# Patient Record
Sex: Female | Born: 1955 | Race: Black or African American | Hispanic: No | Marital: Married | Smoking: Never smoker
Health system: Southern US, Community
[De-identification: ages and names within clinical notes are randomized; demographics above are authoritative.]

## PROBLEM LIST (undated history)

## (undated) ENCOUNTER — Inpatient Hospital Stay: Admission: EM | Payer: Self-pay | Source: Home / Self Care

## (undated) DIAGNOSIS — I10 Essential (primary) hypertension: Secondary | ICD-10-CM

## (undated) DIAGNOSIS — M1990 Unspecified osteoarthritis, unspecified site: Secondary | ICD-10-CM

## (undated) DIAGNOSIS — K219 Gastro-esophageal reflux disease without esophagitis: Secondary | ICD-10-CM

## (undated) DIAGNOSIS — M5430 Sciatica, unspecified side: Secondary | ICD-10-CM

## (undated) HISTORY — DX: Unspecified osteoarthritis, unspecified site: M19.90

---

## 2004-04-11 ENCOUNTER — Ambulatory Visit: Payer: Self-pay | Admitting: Family Medicine

## 2004-06-23 ENCOUNTER — Ambulatory Visit: Payer: Self-pay | Admitting: Family Medicine

## 2006-05-07 ENCOUNTER — Ambulatory Visit: Payer: Self-pay | Admitting: Family Medicine

## 2011-06-23 DIAGNOSIS — R0602 Shortness of breath: Secondary | ICD-10-CM

## 2012-04-26 ENCOUNTER — Ambulatory Visit (HOSPITAL_COMMUNITY)
Admission: RE | Admit: 2012-04-26 | Discharge: 2012-04-26 | Disposition: A | Discharge status: Home / Self Care | Payer: BC Managed Care – PPO | Source: Ambulatory Visit | Attending: Adult Health Nurse Practitioner | Admitting: Adult Health Nurse Practitioner

## 2012-04-26 ENCOUNTER — Other Ambulatory Visit (HOSPITAL_COMMUNITY): Payer: Self-pay | Admitting: Adult Health Nurse Practitioner

## 2012-04-26 DIAGNOSIS — M25559 Pain in unspecified hip: Secondary | ICD-10-CM | POA: Insufficient documentation

## 2012-04-26 DIAGNOSIS — M545 Low back pain, unspecified: Secondary | ICD-10-CM | POA: Insufficient documentation

## 2012-04-26 DIAGNOSIS — M25569 Pain in unspecified knee: Secondary | ICD-10-CM | POA: Insufficient documentation

## 2012-04-26 DIAGNOSIS — M25551 Pain in right hip: Secondary | ICD-10-CM

## 2012-11-30 ENCOUNTER — Other Ambulatory Visit (HOSPITAL_COMMUNITY): Payer: Self-pay | Admitting: Adult Health Nurse Practitioner

## 2012-11-30 DIAGNOSIS — R109 Unspecified abdominal pain: Secondary | ICD-10-CM

## 2012-12-05 ENCOUNTER — Ambulatory Visit (HOSPITAL_COMMUNITY): Payer: BC Managed Care – PPO

## 2012-12-06 ENCOUNTER — Ambulatory Visit (HOSPITAL_COMMUNITY)
Admit: 2012-12-06 | Discharge: 2012-12-06 | Disposition: A | Discharge status: Home / Self Care | Payer: BC Managed Care – PPO | Source: Ambulatory Visit | Attending: Adult Health Nurse Practitioner | Admitting: Adult Health Nurse Practitioner

## 2012-12-06 DIAGNOSIS — R112 Nausea with vomiting, unspecified: Secondary | ICD-10-CM | POA: Insufficient documentation

## 2012-12-06 DIAGNOSIS — R1011 Right upper quadrant pain: Secondary | ICD-10-CM | POA: Insufficient documentation

## 2013-01-03 ENCOUNTER — Encounter (INDEPENDENT_AMBULATORY_CARE_PROVIDER_SITE_OTHER): Payer: Self-pay

## 2013-01-03 ENCOUNTER — Ambulatory Visit (INDEPENDENT_AMBULATORY_CARE_PROVIDER_SITE_OTHER): Payer: BC Managed Care – PPO | Admitting: Urology

## 2013-01-03 DIAGNOSIS — D3000 Benign neoplasm of unspecified kidney: Secondary | ICD-10-CM

## 2013-12-15 ENCOUNTER — Other Ambulatory Visit: Payer: Self-pay | Admitting: Urology

## 2013-12-15 DIAGNOSIS — D3000 Benign neoplasm of unspecified kidney: Secondary | ICD-10-CM

## 2013-12-28 ENCOUNTER — Encounter (INDEPENDENT_AMBULATORY_CARE_PROVIDER_SITE_OTHER): Payer: Self-pay | Admitting: *Deleted

## 2013-12-28 ENCOUNTER — Ambulatory Visit (HOSPITAL_COMMUNITY)
Admission: RE | Admit: 2013-12-28 | Discharge: 2013-12-28 | Disposition: A | Discharge status: Home / Self Care | Payer: BC Managed Care – PPO | Source: Ambulatory Visit | Attending: Urology | Admitting: Urology

## 2013-12-28 DIAGNOSIS — D3000 Benign neoplasm of unspecified kidney: Secondary | ICD-10-CM | POA: Diagnosis present

## 2014-01-02 ENCOUNTER — Ambulatory Visit (INDEPENDENT_AMBULATORY_CARE_PROVIDER_SITE_OTHER): Payer: BC Managed Care – PPO | Admitting: Urology

## 2014-01-02 DIAGNOSIS — D3000 Benign neoplasm of unspecified kidney: Secondary | ICD-10-CM

## 2014-12-24 ENCOUNTER — Other Ambulatory Visit: Payer: Self-pay | Admitting: Urology

## 2014-12-24 DIAGNOSIS — D3000 Benign neoplasm of unspecified kidney: Secondary | ICD-10-CM

## 2014-12-27 ENCOUNTER — Ambulatory Visit (HOSPITAL_COMMUNITY)
Admission: RE | Admit: 2014-12-27 | Discharge: 2014-12-27 | Disposition: A | Discharge status: Home / Self Care | Payer: BLUE CROSS/BLUE SHIELD | Source: Ambulatory Visit | Attending: Urology | Admitting: Urology

## 2014-12-27 DIAGNOSIS — D1771 Benign lipomatous neoplasm of kidney: Secondary | ICD-10-CM | POA: Diagnosis not present

## 2014-12-27 DIAGNOSIS — D3000 Benign neoplasm of unspecified kidney: Secondary | ICD-10-CM

## 2015-01-01 ENCOUNTER — Ambulatory Visit (INDEPENDENT_AMBULATORY_CARE_PROVIDER_SITE_OTHER): Payer: BLUE CROSS/BLUE SHIELD | Admitting: Urology

## 2015-01-01 DIAGNOSIS — D3002 Benign neoplasm of left kidney: Secondary | ICD-10-CM | POA: Diagnosis not present

## 2015-12-06 ENCOUNTER — Other Ambulatory Visit: Payer: Self-pay | Admitting: Urology

## 2015-12-06 DIAGNOSIS — D3002 Benign neoplasm of left kidney: Secondary | ICD-10-CM

## 2016-01-05 ENCOUNTER — Emergency Department (HOSPITAL_COMMUNITY): Payer: BLUE CROSS/BLUE SHIELD

## 2016-01-05 ENCOUNTER — Inpatient Hospital Stay (HOSPITAL_COMMUNITY)
Admission: EM | Admit: 2016-01-05 | Discharge: 2016-01-09 | DRG: 305 | Disposition: A | Discharge status: Home / Self Care | Payer: BLUE CROSS/BLUE SHIELD | Attending: Internal Medicine | Admitting: Internal Medicine

## 2016-01-05 ENCOUNTER — Encounter (HOSPITAL_COMMUNITY): Payer: Self-pay

## 2016-01-05 DIAGNOSIS — C169 Malignant neoplasm of stomach, unspecified: Secondary | ICD-10-CM

## 2016-01-05 DIAGNOSIS — R079 Chest pain, unspecified: Secondary | ICD-10-CM | POA: Diagnosis not present

## 2016-01-05 DIAGNOSIS — I248 Other forms of acute ischemic heart disease: Secondary | ICD-10-CM | POA: Diagnosis present

## 2016-01-05 DIAGNOSIS — T461X5A Adverse effect of calcium-channel blockers, initial encounter: Secondary | ICD-10-CM | POA: Diagnosis not present

## 2016-01-05 DIAGNOSIS — E876 Hypokalemia: Secondary | ICD-10-CM | POA: Diagnosis not present

## 2016-01-05 DIAGNOSIS — I952 Hypotension due to drugs: Secondary | ICD-10-CM | POA: Diagnosis not present

## 2016-01-05 DIAGNOSIS — K219 Gastro-esophageal reflux disease without esophagitis: Secondary | ICD-10-CM | POA: Diagnosis present

## 2016-01-05 DIAGNOSIS — K449 Diaphragmatic hernia without obstruction or gangrene: Secondary | ICD-10-CM | POA: Diagnosis present

## 2016-01-05 DIAGNOSIS — Z79899 Other long term (current) drug therapy: Secondary | ICD-10-CM

## 2016-01-05 DIAGNOSIS — I10 Essential (primary) hypertension: Secondary | ICD-10-CM

## 2016-01-05 DIAGNOSIS — R778 Other specified abnormalities of plasma proteins: Secondary | ICD-10-CM

## 2016-01-05 DIAGNOSIS — K3189 Other diseases of stomach and duodenum: Secondary | ICD-10-CM | POA: Diagnosis present

## 2016-01-05 DIAGNOSIS — I160 Hypertensive urgency: Principal | ICD-10-CM | POA: Diagnosis present

## 2016-01-05 DIAGNOSIS — Z791 Long term (current) use of non-steroidal anti-inflammatories (NSAID): Secondary | ICD-10-CM

## 2016-01-05 DIAGNOSIS — R7989 Other specified abnormal findings of blood chemistry: Secondary | ICD-10-CM

## 2016-01-05 HISTORY — DX: Gastro-esophageal reflux disease without esophagitis: K21.9

## 2016-01-05 HISTORY — DX: Essential (primary) hypertension: I10

## 2016-01-05 LAB — CBC WITH DIFFERENTIAL/PLATELET
Basophils Absolute: 0 10*3/uL (ref 0.0–0.1)
Basophils Relative: 0 %
EOS ABS: 0.1 10*3/uL (ref 0.0–0.7)
Eosinophils Relative: 1 %
HEMATOCRIT: 38.3 % (ref 36.0–46.0)
HEMOGLOBIN: 12.9 g/dL (ref 12.0–15.0)
LYMPHS ABS: 1.7 10*3/uL (ref 0.7–4.0)
LYMPHS PCT: 17 %
MCH: 30.7 pg (ref 26.0–34.0)
MCHC: 33.7 g/dL (ref 30.0–36.0)
MCV: 91.2 fL (ref 78.0–100.0)
MONOS PCT: 6 %
Monocytes Absolute: 0.6 10*3/uL (ref 0.1–1.0)
NEUTROS PCT: 76 %
Neutro Abs: 7.5 10*3/uL (ref 1.7–7.7)
Platelets: 283 10*3/uL (ref 150–400)
RBC: 4.2 MIL/uL (ref 3.87–5.11)
RDW: 14 % (ref 11.5–15.5)
WBC: 9.8 10*3/uL (ref 4.0–10.5)

## 2016-01-05 LAB — COMPREHENSIVE METABOLIC PANEL
ALK PHOS: 48 U/L (ref 38–126)
ALT: 16 U/L (ref 14–54)
ANION GAP: 9 (ref 5–15)
AST: 20 U/L (ref 15–41)
Albumin: 4.2 g/dL (ref 3.5–5.0)
BILIRUBIN TOTAL: 0.3 mg/dL (ref 0.3–1.2)
BUN: 16 mg/dL (ref 6–20)
CALCIUM: 10.1 mg/dL (ref 8.9–10.3)
CO2: 27 mmol/L (ref 22–32)
CREATININE: 0.94 mg/dL (ref 0.44–1.00)
Chloride: 104 mmol/L (ref 101–111)
Glucose, Bld: 92 mg/dL (ref 65–99)
Potassium: 3.5 mmol/L (ref 3.5–5.1)
SODIUM: 140 mmol/L (ref 135–145)
TOTAL PROTEIN: 8 g/dL (ref 6.5–8.1)

## 2016-01-05 LAB — I-STAT TROPONIN, ED: TROPONIN I, POC: 0.03 ng/mL (ref 0.00–0.08)

## 2016-01-05 MED ORDER — CLONIDINE HCL 0.2 MG PO TABS
0.2000 mg | ORAL_TABLET | Freq: Every day | ORAL | Status: DC
  Administered 2016-01-05: 0.2 mg via ORAL
  Filled 2016-01-05: qty 1
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
## 2016-01-05 NOTE — ED Notes (Signed)
Pt ambulatory to BR with steady gait. Family at bedside.

## 2016-01-05 NOTE — ED Notes (Signed)
Pt to xray via stretcher

## 2016-01-05 NOTE — ED Notes (Signed)
Pt ambulated to restroom without difficulty
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
## 2016-01-06 ENCOUNTER — Encounter (HOSPITAL_COMMUNITY): Payer: Self-pay | Admitting: Internal Medicine

## 2016-01-06 ENCOUNTER — Emergency Department (HOSPITAL_COMMUNITY): Payer: BLUE CROSS/BLUE SHIELD

## 2016-01-06 DIAGNOSIS — R079 Chest pain, unspecified: Secondary | ICD-10-CM

## 2016-01-06 DIAGNOSIS — Z791 Long term (current) use of non-steroidal anti-inflammatories (NSAID): Secondary | ICD-10-CM | POA: Diagnosis not present

## 2016-01-06 DIAGNOSIS — R748 Abnormal levels of other serum enzymes: Secondary | ICD-10-CM | POA: Diagnosis not present

## 2016-01-06 DIAGNOSIS — K3189 Other diseases of stomach and duodenum: Secondary | ICD-10-CM | POA: Diagnosis present

## 2016-01-06 DIAGNOSIS — K319 Disease of stomach and duodenum, unspecified: Secondary | ICD-10-CM | POA: Diagnosis not present

## 2016-01-06 DIAGNOSIS — I160 Hypertensive urgency: Secondary | ICD-10-CM | POA: Diagnosis present

## 2016-01-06 DIAGNOSIS — I952 Hypotension due to drugs: Secondary | ICD-10-CM | POA: Diagnosis not present

## 2016-01-06 DIAGNOSIS — Z79899 Other long term (current) drug therapy: Secondary | ICD-10-CM | POA: Diagnosis not present

## 2016-01-06 DIAGNOSIS — T461X5A Adverse effect of calcium-channel blockers, initial encounter: Secondary | ICD-10-CM | POA: Diagnosis not present

## 2016-01-06 DIAGNOSIS — R7989 Other specified abnormal findings of blood chemistry: Secondary | ICD-10-CM

## 2016-01-06 DIAGNOSIS — K219 Gastro-esophageal reflux disease without esophagitis: Secondary | ICD-10-CM | POA: Insufficient documentation

## 2016-01-06 DIAGNOSIS — I248 Other forms of acute ischemic heart disease: Secondary | ICD-10-CM | POA: Diagnosis present

## 2016-01-06 DIAGNOSIS — I10 Essential (primary) hypertension: Secondary | ICD-10-CM | POA: Insufficient documentation

## 2016-01-06 DIAGNOSIS — R072 Precordial pain: Secondary | ICD-10-CM | POA: Diagnosis not present

## 2016-01-06 DIAGNOSIS — E876 Hypokalemia: Secondary | ICD-10-CM | POA: Diagnosis not present

## 2016-01-06 DIAGNOSIS — R778 Other specified abnormalities of plasma proteins: Secondary | ICD-10-CM | POA: Insufficient documentation

## 2016-01-06 DIAGNOSIS — K449 Diaphragmatic hernia without obstruction or gangrene: Secondary | ICD-10-CM | POA: Diagnosis present

## 2016-01-06 LAB — PROTIME-INR
INR: 1.07
PROTHROMBIN TIME: 14 s (ref 11.4–15.2)

## 2016-01-06 LAB — TYPE AND SCREEN
ABO/RH(D): O POS
ANTIBODY SCREEN: NEGATIVE

## 2016-01-06 LAB — RAPID URINE DRUG SCREEN, HOSP PERFORMED
AMPHETAMINES: NOT DETECTED
BARBITURATES: NOT DETECTED
BENZODIAZEPINES: NOT DETECTED
Cocaine: NOT DETECTED
Opiates: NOT DETECTED
Tetrahydrocannabinol: NOT DETECTED

## 2016-01-06 LAB — MRSA PCR SCREENING: MRSA by PCR: NEGATIVE

## 2016-01-06 LAB — URINE MICROSCOPIC-ADD ON: RBC / HPF: NONE SEEN RBC/hpf (ref 0–5)

## 2016-01-06 LAB — URINALYSIS, ROUTINE W REFLEX MICROSCOPIC
GLUCOSE, UA: NEGATIVE mg/dL
HGB URINE DIPSTICK: NEGATIVE
Ketones, ur: NEGATIVE mg/dL
Leukocytes, UA: NEGATIVE
Nitrite: NEGATIVE
PH: 5 (ref 5.0–8.0)
Protein, ur: 30 mg/dL — AB
SPECIFIC GRAVITY, URINE: 1.045 — AB (ref 1.005–1.030)

## 2016-01-06 LAB — TROPONIN I
TROPONIN I: 0.08 ng/mL — AB (ref <0.03)
Troponin I: 0.05 ng/mL (ref <0.03)
Troponin I: 0.06 ng/mL (ref <0.03)
Troponin I: 0.04 ng/mL (ref <0.03)

## 2016-01-06 LAB — ABO/RH: ABO/RH(D): O POS

## 2016-01-06 LAB — I-STAT TROPONIN, ED: Troponin i, poc: 0.09 ng/mL (ref 0.00–0.08)

## 2016-01-06 LAB — LIPID PANEL
CHOL/HDL RATIO: 3.3 ratio
Cholesterol: 201 mg/dL — ABNORMAL HIGH (ref 0–200)
HDL: 61 mg/dL (ref >40)
LDL CALC: 125 mg/dL — AB (ref 0–99)
TRIGLYCERIDES: 76 mg/dL (ref <150)
VLDL: 15 mg/dL (ref 0–40)

## 2016-01-06 LAB — APTT: aPTT: 30 seconds (ref 24–36)

## 2016-01-06 MED ORDER — MORPHINE SULFATE (PF) 4 MG/ML IV SOLN: 2.0000 mg | INTRAVENOUS | Status: DC | PRN

## 2016-01-06 MED ORDER — ALPRAZOLAM 0.25 MG PO TABS
0.2500 mg | ORAL_TABLET | Freq: Two times a day (BID) | ORAL | Status: DC | PRN
  Administered 2016-01-08: 0.25 mg via ORAL
  Filled 2016-01-06: qty 1

## 2016-01-06 MED ORDER — ASPIRIN EC 325 MG PO TBEC
325.0000 mg | DELAYED_RELEASE_TABLET | Freq: Once | ORAL | Status: AC
Start: 2016-01-06 — End: 2016-01-06
  Administered 2016-01-06: 325 mg via ORAL
  Filled 2016-01-06: qty 1

## 2016-01-06 MED ORDER — ONDANSETRON HCL 4 MG/2ML IJ SOLN
4.0000 mg | Freq: Three times a day (TID) | INTRAMUSCULAR | Status: DC | PRN
  Administered 2016-01-07: 4 mg via INTRAVENOUS
  Filled 2016-01-06 (×2): qty 2

## 2016-01-06 MED ORDER — ZOLPIDEM TARTRATE 5 MG PO TABS: 5.0000 mg | ORAL_TABLET | Freq: Every evening | ORAL | Status: DC | PRN

## 2016-01-06 MED ORDER — MORPHINE SULFATE (PF) 2 MG/ML IV SOLN: 1.0000 mg | INTRAVENOUS | Status: DC | PRN

## 2016-01-06 MED ORDER — SODIUM CHLORIDE 0.9 % IV SOLN
INTRAVENOUS | Status: DC
  Administered 2016-01-06: 07:00:00 via INTRAVENOUS

## 2016-01-06 MED ORDER — IOPAMIDOL (ISOVUE-370) INJECTION 76%
INTRAVENOUS | Status: AC
  Administered 2016-01-06: 100 mL
  Filled 2016-01-06: qty 100

## 2016-01-06 MED ORDER — ACETAMINOPHEN 325 MG PO TABS
650.0000 mg | ORAL_TABLET | ORAL | Status: DC | PRN
Start: 2016-01-06 — End: 2016-01-09
  Administered 2016-01-08: 650 mg via ORAL
  Filled 2016-01-06: qty 2

## 2016-01-06 MED ORDER — CLONIDINE HCL 0.2 MG PO TABS
0.2000 mg | ORAL_TABLET | Freq: Three times a day (TID) | ORAL | Status: DC
  Administered 2016-01-06 (×2): 0.2 mg via ORAL
  Filled 2016-01-06 (×2): qty 1

## 2016-01-06 MED ORDER — FERROUS FUMARATE 324 (106 FE) MG PO TABS
1.0000 | ORAL_TABLET | Freq: Every day | ORAL | Status: DC
  Administered 2016-01-06 – 2016-01-09 (×3): 106 mg via ORAL
  Filled 2016-01-06 (×4): qty 1

## 2016-01-06 MED ORDER — AMLODIPINE BESYLATE 10 MG PO TABS
10.0000 mg | ORAL_TABLET | Freq: Every day | ORAL | Status: DC
  Administered 2016-01-06: 10 mg via ORAL
  Filled 2016-01-06: qty 1

## 2016-01-06 MED ORDER — HYDRALAZINE HCL 20 MG/ML IJ SOLN
10.0000 mg | Freq: Once | INTRAMUSCULAR | Status: AC
  Administered 2016-01-06: 10 mg via INTRAVENOUS
  Filled 2016-01-06: qty 1

## 2016-01-06 MED ORDER — ATORVASTATIN CALCIUM 40 MG PO TABS
40.0000 mg | ORAL_TABLET | Freq: Every day | ORAL | Status: DC
  Administered 2016-01-06 – 2016-01-08 (×3): 40 mg via ORAL
  Filled 2016-01-06 (×3): qty 1

## 2016-01-06 MED ORDER — HYDRALAZINE HCL 20 MG/ML IJ SOLN
5.0000 mg | INTRAMUSCULAR | Status: DC | PRN
  Administered 2016-01-07: 5 mg via INTRAVENOUS
  Filled 2016-01-06: qty 1

## 2016-01-06 MED ORDER — SODIUM CHLORIDE 0.9 % IV SOLN
INTRAVENOUS | Status: AC
  Administered 2016-01-06: 20:00:00 via INTRAVENOUS

## 2016-01-06 MED ORDER — TRANDOLAPRIL 2 MG PO TABS
2.0000 mg | ORAL_TABLET | Freq: Every day | ORAL | Status: DC
  Filled 2016-01-06: qty 1

## 2016-01-06 MED ORDER — SODIUM CHLORIDE 0.9 % IV BOLUS (SEPSIS)
500.0000 mL | Freq: Once | INTRAVENOUS | Status: AC
  Administered 2016-01-06: 500 mL via INTRAVENOUS

## 2016-01-06 MED ORDER — PANTOPRAZOLE SODIUM 40 MG IV SOLR
40.0000 mg | Freq: Two times a day (BID) | INTRAVENOUS | Status: DC
  Administered 2016-01-06 – 2016-01-08 (×5): 40 mg via INTRAVENOUS
  Filled 2016-01-06 (×5): qty 40

## 2016-01-06 MED ORDER — HYDRALAZINE HCL 20 MG/ML IJ SOLN: 5.0000 mg | INTRAMUSCULAR | Status: DC | PRN

## 2016-01-06 MED ORDER — VITAMIN D (ERGOCALCIFEROL) 1.25 MG (50000 UNIT) PO CAPS: 50000.0000 [IU] | ORAL_CAPSULE | ORAL | Status: DC

## 2016-01-06 MED ORDER — ASPIRIN EC 81 MG PO TBEC
81.0000 mg | DELAYED_RELEASE_TABLET | Freq: Once | ORAL | Status: AC
  Administered 2016-01-06: 81 mg via ORAL
  Filled 2016-01-06: qty 1

## 2016-01-06 MED ORDER — CLONIDINE HCL 0.1 MG PO TABS
0.1000 mg | ORAL_TABLET | Freq: Three times a day (TID) | ORAL | Status: DC
Start: 2016-01-06 — End: 2016-01-06

## 2016-01-06 MED ORDER — CLONIDINE HCL 0.1 MG PO TABS
0.1000 mg | ORAL_TABLET | Freq: Every day | ORAL | Status: DC
  Administered 2016-01-07 – 2016-01-08 (×2): 0.1 mg via ORAL
  Filled 2016-01-06 (×2): qty 1

## 2016-01-06 MED ORDER — NITROGLYCERIN 0.4 MG SL SUBL: 0.4000 mg | SUBLINGUAL_TABLET | SUBLINGUAL | Status: DC | PRN

## 2016-01-06 MED ORDER — IOPAMIDOL (ISOVUE-300) INJECTION 61%
INTRAVENOUS | Status: AC
Start: 2016-01-06 — End: 2016-01-06
  Filled 2016-01-06: qty 100
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
## 2016-01-06 NOTE — ED Notes (Signed)
Attempted report x 2 

## 2016-01-06 NOTE — ED Notes (Signed)
\  [id number]

## 2016-01-06 NOTE — ED Notes (Signed)
Dr. Horton at the bedside.  

## 2016-01-06 NOTE — ED Notes (Signed)
Assisted to ambulate to bathroom.  Good steady gait noted.  Denies having any pain states I'm just sore in the center of my chest but I don't consider that pain.  Bed in lowest position with call bell within reach.  Encouraged to call for assistance as needed.

## 2016-01-06 NOTE — ED Notes (Signed)
Patient transported to CT 

## 2016-01-06 NOTE — ED Notes (Signed)
In CT at this time

## 2016-01-06 NOTE — Progress Notes (Signed)
01/05/2016  7:17 PM  01/06/2016 11:42 AM  Morgan Owen was seen and examined.  The H&P by the admitting provider, orders, imaging was reviewed.  The patient is hypotensive this morning after BP meds,  Would hold off on BP meds at this time, bolus 500 NS.  Also, called in consult for [facility] for gastric mass concerning for malignancy.  Pt reports never having EGD or colonoscopy and only reports history of GERD.  No smoking history.  Pt has been NPO, will order diet now.  Please see orders.  Will continue to follow.   Murvin Natal, MD [facility]

## 2016-01-07 LAB — GLUCOSE, CAPILLARY: Glucose-Capillary: 117 mg/dL — ABNORMAL HIGH (ref 65–99)

## 2016-01-07 LAB — CBC
HCT: 31.9 % — ABNORMAL LOW (ref 36.0–46.0)
HEMOGLOBIN: 10.6 g/dL — AB (ref 12.0–15.0)
MCH: 30.5 pg (ref 26.0–34.0)
MCHC: 33.2 g/dL (ref 30.0–36.0)
MCV: 91.7 fL (ref 78.0–100.0)
Platelets: 291 10*3/uL (ref 150–400)
RBC: 3.48 MIL/uL — AB (ref 3.87–5.11)
RDW: 14.4 % (ref 11.5–15.5)
WBC: 9.3 10*3/uL (ref 4.0–10.5)

## 2016-01-07 LAB — CEA: CEA: 2.7 ng/mL (ref 0.0–4.7)

## 2016-01-07 LAB — HEMOGLOBIN A1C
HEMOGLOBIN A1C: 5.5 % (ref 4.8–5.6)
Mean Plasma Glucose: 111 mg/dL

## 2016-01-07 LAB — BASIC METABOLIC PANEL
Anion gap: 6 (ref 5–15)
BUN: 23 mg/dL — AB (ref 6–20)
CHLORIDE: 109 mmol/L (ref 101–111)
CO2: 23 mmol/L (ref 22–32)
Calcium: 8.4 mg/dL — ABNORMAL LOW (ref 8.9–10.3)
Creatinine, Ser: 1.09 mg/dL — ABNORMAL HIGH (ref 0.44–1.00)
GFR calc Af Amer: >60 mL/min (ref >60)
GFR calc non Af Amer: 54 mL/min — ABNORMAL LOW (ref >60)
GLUCOSE: 90 mg/dL (ref 65–99)
POTASSIUM: 4.8 mmol/L (ref 3.5–5.1)
Sodium: 138 mmol/L (ref 135–145)

## 2016-01-07 LAB — AFP TUMOR MARKER: AFP TUMOR MARKER: 3.3 ng/mL (ref 0.0–8.3)

## 2016-01-07 LAB — CA 125: CA 125: 9.2 U/mL (ref 0.0–38.1)

## 2016-01-07 MED ORDER — AMLODIPINE BESYLATE 5 MG PO TABS
5.0000 mg | ORAL_TABLET | Freq: Every day | ORAL | Status: DC
  Administered 2016-01-07 – 2016-01-08 (×2): 5 mg via ORAL
  Filled 2016-01-07 (×3): qty 1

## 2016-01-07 MED ORDER — SODIUM CHLORIDE 0.9 % IV SOLN
INTRAVENOUS | Status: DC
  Administered 2016-01-08 (×2): via INTRAVENOUS

## 2016-01-07 MED ORDER — HYDRALAZINE HCL 20 MG/ML IJ SOLN
10.0000 mg | Freq: Once | INTRAMUSCULAR | Status: AC
  Administered 2016-01-07: 10 mg via INTRAVENOUS
  Filled 2016-01-07: qty 1
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
## 2016-01-07 NOTE — Progress Notes (Addendum)
Pt BP was high this am 140's-170's SBP , @ 1046 BP 124/71 HR 53, did not give clonidine and noted new order for norvasc. Pt BP dropped significantly yesterday with clonidine 0.2mg  with a higher BP than this am, dose today 0.1mg , did not give, will monitor and give if BP comes back up significantly so pt BP will not bottom out today.   @ 1100 BP 110/50, HR 49, pt sleeping.

## 2016-01-07 NOTE — Progress Notes (Signed)
At 1334 pt BP 196/74, pt given her scheduled dose of clonidine at 1337.  Will monitor effectiveness due to pt frequent changes in highs and lows of BP.
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
## 2016-01-08 ENCOUNTER — Inpatient Hospital Stay (HOSPITAL_COMMUNITY): Payer: BLUE CROSS/BLUE SHIELD | Admitting: Certified Registered Nurse Anesthetist

## 2016-01-08 ENCOUNTER — Encounter (HOSPITAL_COMMUNITY): Payer: Self-pay | Admitting: Certified Registered Nurse Anesthetist

## 2016-01-08 ENCOUNTER — Inpatient Hospital Stay (HOSPITAL_COMMUNITY): Payer: BLUE CROSS/BLUE SHIELD

## 2016-01-08 ENCOUNTER — Encounter (HOSPITAL_COMMUNITY)
Admission: EM | Disposition: A | Discharge status: Home / Self Care | Payer: Self-pay | Source: Home / Self Care | Attending: Family Medicine

## 2016-01-08 DIAGNOSIS — I160 Hypertensive urgency: Principal | ICD-10-CM

## 2016-01-08 DIAGNOSIS — R079 Chest pain, unspecified: Secondary | ICD-10-CM

## 2016-01-08 DIAGNOSIS — K219 Gastro-esophageal reflux disease without esophagitis: Secondary | ICD-10-CM

## 2016-01-08 DIAGNOSIS — R072 Precordial pain: Secondary | ICD-10-CM

## 2016-01-08 DIAGNOSIS — R748 Abnormal levels of other serum enzymes: Secondary | ICD-10-CM

## 2016-01-08 DIAGNOSIS — K319 Disease of stomach and duodenum, unspecified: Secondary | ICD-10-CM

## 2016-01-08 HISTORY — PX: ESOPHAGOGASTRODUODENOSCOPY: SHX5428

## 2016-01-08 LAB — BASIC METABOLIC PANEL
ANION GAP: 11 (ref 5–15)
BUN: 12 mg/dL (ref 6–20)
CALCIUM: 9.4 mg/dL (ref 8.9–10.3)
CHLORIDE: 103 mmol/L (ref 101–111)
CO2: 24 mmol/L (ref 22–32)
Creatinine, Ser: 0.92 mg/dL (ref 0.44–1.00)
GFR calc Af Amer: >60 mL/min (ref >60)
GFR calc non Af Amer: >60 mL/min (ref >60)
GLUCOSE: 113 mg/dL — AB (ref 65–99)
Potassium: 3.3 mmol/L — ABNORMAL LOW (ref 3.5–5.1)
Sodium: 138 mmol/L (ref 135–145)

## 2016-01-08 LAB — ECHOCARDIOGRAM COMPLETE
HEIGHTINCHES: 66 in
WEIGHTICAEL: 3220.48 [oz_av]

## 2016-01-08 LAB — CBC
HEMATOCRIT: 38.3 % (ref 36.0–46.0)
HEMOGLOBIN: 12.6 g/dL (ref 12.0–15.0)
MCH: 30 pg (ref 26.0–34.0)
MCHC: 32.9 g/dL (ref 30.0–36.0)
MCV: 91.2 fL (ref 78.0–100.0)
Platelets: 297 10*3/uL (ref 150–400)
RBC: 4.2 MIL/uL (ref 3.87–5.11)
RDW: 13.9 % (ref 11.5–15.5)
WBC: 10.3 10*3/uL (ref 4.0–10.5)

## 2016-01-08 SURGERY — EGD (ESOPHAGOGASTRODUODENOSCOPY)
Anesthesia: Monitor Anesthesia Care

## 2016-01-08 MED ORDER — LABETALOL HCL 5 MG/ML IV SOLN
INTRAVENOUS | Status: AC
  Filled 2016-01-08: qty 4

## 2016-01-08 MED ORDER — MORPHINE SULFATE (PF) 2 MG/ML IV SOLN: 2.0000 mg | INTRAVENOUS | Status: DC | PRN

## 2016-01-08 MED ORDER — LABETALOL HCL 5 MG/ML IV SOLN
10.0000 mg | Freq: Once | INTRAVENOUS | Status: AC
  Administered 2016-01-08: 10 mg via INTRAVENOUS

## 2016-01-08 MED ORDER — SODIUM CHLORIDE 0.9 % IV SOLN: INTRAVENOUS | Status: DC

## 2016-01-08 MED ORDER — PANTOPRAZOLE SODIUM 40 MG PO TBEC
40.0000 mg | DELAYED_RELEASE_TABLET | Freq: Every day | ORAL | Status: DC
  Administered 2016-01-09: 40 mg via ORAL
  Filled 2016-01-08: qty 1

## 2016-01-08 MED ORDER — LORAZEPAM 2 MG/ML IJ SOLN
0.5000 mg | Freq: Once | INTRAMUSCULAR | Status: AC
  Administered 2016-01-08: 0.5 mg via INTRAVENOUS

## 2016-01-08 MED ORDER — HYDRALAZINE HCL 20 MG/ML IJ SOLN
10.0000 mg | Freq: Once | INTRAMUSCULAR | Status: AC
  Administered 2016-01-08: 10 mg via INTRAVENOUS
  Filled 2016-01-08: qty 1

## 2016-01-08 MED ORDER — PROPOFOL 500 MG/50ML IV EMUL
INTRAVENOUS | Status: DC | PRN
  Administered 2016-01-08: 100 ug/kg/min via INTRAVENOUS

## 2016-01-08 MED ORDER — POTASSIUM CHLORIDE CRYS ER 20 MEQ PO TBCR
20.0000 meq | EXTENDED_RELEASE_TABLET | Freq: Once | ORAL | Status: AC
  Administered 2016-01-08: 20 meq via ORAL
  Filled 2016-01-08: qty 1

## 2016-01-08 MED ORDER — LACTATED RINGERS IV SOLN
INTRAVENOUS | Status: DC
  Administered 2016-01-08: 10:00:00 via INTRAVENOUS

## 2016-01-08 MED ORDER — LORAZEPAM 2 MG/ML IJ SOLN
INTRAMUSCULAR | Status: AC
  Filled 2016-01-08: qty 1

## 2016-01-08 NOTE — Transfer of Care (Signed)
Immediate Anesthesia Transfer of Care Note  Patient: Morgan Owen  Procedure(s) Performed: Procedure(s): ESOPHAGOGASTRODUODENOSCOPY (EGD) (N/A)  Patient Location: Endoscopy Unit  Anesthesia Type:MAC  Level of Consciousness: awake, alert  and oriented  Airway & Oxygen Therapy: Patient Spontanous Breathing and Patient connected to nasal cannula oxygen  Post-op Assessment: Report given to RN, Post -op Vital signs reviewed and stable and Patient moving all extremities X 4  Post vital signs: Reviewed and stable  Last Vitals:  Vitals:   01/08/16 0900 01/08/16 0950  BP: (!) 171/82 (!) 215/100  Pulse: 88   Resp: 19 20  Temp:  36.9 C    Last Pain:  Vitals:   01/08/16 0950  TempSrc: Oral  PainSc:          Complications: No apparent anesthesia complications

## 2016-01-08 NOTE — Op Note (Signed)
[facility] Patient Name: Morgan Owen Procedure Date : 01/08/2016 MRN: [id number] Attending MD: Otis Brace , MD Date of Birth: April 16, 1955 CSN: [id number] Age: 60 Admit Type: Inpatient Procedure:                Upper GI endoscopy Indications:              Abnormal CT of the GI tract Providers:                Otis Brace, MD, Carolynn Comment, RN,                            William Dalton, Technician Referring MD:              Medicines:                Sedation Administered by an Anesthesia Professional Complications:            No immediate complications. Estimated Blood Loss:     Estimated blood loss was minimal. Procedure:                Pre-Anesthesia Assessment:                           - Prior to the procedure, a History and Physical                            was performed, and patient medications and                            allergies were reviewed. The patient's tolerance of                            previous anesthesia was also reviewed. The risks                            and benefits of the procedure and the sedation                            options and risks were discussed with the patient.                            All questions were answered, and informed consent                            was obtained. Prior Anticoagulants: The patient has                            taken no previous anticoagulant or antiplatelet                            agents. ASA Grade Assessment: II - A patient with                            mild systemic disease. After reviewing the risks  and benefits, the patient was deemed in                            satisfactory condition to undergo the procedure.                           After obtaining informed consent, the endoscope was                            passed under direct vision. Throughout the                            procedure, the patient's blood pressure, pulse, and                   oxygen saturations were monitored continuously. The                            [id number]) scope was introduced through the                            mouth, and advanced to the second part of duodenum.                            The upper GI endoscopy was accomplished with ease.                            The patient tolerated the procedure well. Scope In: Scope Out: Findings:      The Z-line was regular and was found 38 cm from the incisors.      A small hiatal hernia was present.      The examined esophagus was normal.      Multiple dispersed, medium non-bleeding erosions were found in the       gastric antrum and in the prepyloric region of the stomach. There were       no stigmata of recent bleeding. Biopsies were taken with a cold forceps       for histology.      Localized moderately congested mucosa with thickening of folds was found       in the prepyloric region of the stomach. Biopsies were taken with a cold       forceps for histology.      Localized moderately congested mucosa with thick folds and polypoid       apprearing mucosa without active bleeding and with no stigmata of       bleeding was found in the duodenal bulb. Biopsies were taken with a cold       forceps for histology.      The first portion of the duodenum and second portion of the duodenum       were normal. Impression:               - Z-line regular, 38 cm from the incisors.                           - Small hiatal hernia.                           -  Normal esophagus.                           - Non-bleeding erosive gastropathy. Biopsied.                           - Congestive gastropathy. Biopsied.                           - Congested duodenal mucosa. Biopsied.                           - Normal first portion of the duodenum and second                            portion of the duodenum. Moderate Sedation:      Moderate (conscious) sedation was personally administered by an        anesthesia professional. The following parameters were monitored: oxygen       saturation, heart rate, blood pressure, and response to care. Recommendation:           - Return patient to hospital ward for ongoing care.                           - Resume regular diet.                           - Continue present medications.                           - Await pathology results.                           - Return to my office in 6 weeks. Procedure Code(s):        --- Professional ---                           [phone], Esophagogastroduodenoscopy, flexible,                            transoral; with biopsy, single or multiple Diagnosis Code(s):        --- Professional ---                           K44.9, Diaphragmatic hernia without obstruction or                            gangrene                           K31.89, Other diseases of stomach and duodenum                           R93.3, Abnormal findings on diagnostic imaging of                            other parts of digestive tract CPT copyright 2016 [employer]. All  rights reserved. The codes documented in this report are preliminary and upon coder review may  be revised to meet current compliance requirements. Otis Brace, MD Otis Brace, MD 01/08/2016 11:11:20 AM Number of Addenda: 0

## 2016-01-08 NOTE — Progress Notes (Signed)
  Echocardiogram 2D Echocardiogram has been performed.  Jennette Dubin 01/08/2016, 1:58 PM
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
## 2016-01-08 NOTE — Progress Notes (Signed)
Patient had episode of feeling sick, nauseated and diaphoretic denies any chest pain or short of breath. SBP remains >180. Noted pt. Had developed above symptoms after dose of hydralazine IV and protonix. K. Schorr NP was notified.  BP was recheked and SBP was in 150's was given IV zofran with relief. Will continue to monitor patient.Morgan Owen

## 2016-01-08 NOTE — Anesthesia Preprocedure Evaluation (Signed)
Anesthesia Evaluation  Patient identified by MRN, date of birth, ID band Patient awake    Reviewed: Allergy & Precautions, H&P , NPO status , Patient's Chart, lab work & pertinent test results  Airway Mallampati: II   Neck ROM: full    Dental   Pulmonary neg pulmonary ROS,    breath sounds clear to auscultation       Cardiovascular hypertension,  Rhythm:regular Rate:Normal     Neuro/Psych    GI/Hepatic GERD  ,  Endo/Other    Renal/GU      Musculoskeletal   Abdominal   Peds  Hematology   Anesthesia Other Findings   Reproductive/Obstetrics                             Anesthesia Physical Anesthesia Plan  ASA: II  Anesthesia Plan: MAC   Post-op Pain Management:    Induction: Intravenous  Airway Management Planned: Nasal Cannula  Additional Equipment:   Intra-op Plan:   Post-operative Plan:   Informed Consent: I have reviewed the patients History and Physical, chart, labs and discussed the procedure including the risks, benefits and alternatives for the proposed anesthesia with the patient or authorized representative who has indicated his/her understanding and acceptance.     Plan Discussed with: CRNA, Anesthesiologist and Surgeon  Anesthesia Plan Comments:         Anesthesia Quick Evaluation

## 2016-01-08 NOTE — Anesthesia Procedure Notes (Signed)
Procedure Name: MAC Date/Time: 01/08/2016 10:48 AM Performed by: Garrison [place] T Pre-anesthesia Checklist: Patient identified, Emergency Drugs available, Suction available and Patient being monitored Patient Re-evaluated:Patient Re-evaluated prior to inductionOxygen Delivery Method: Nasal cannula Intubation Type: IV induction Placement Confirmation: positive ETCO2

## 2016-01-08 NOTE — Brief Op Note (Signed)
01/05/2016 - 01/08/2016  11:17 AM  PATIENT:  Morgan Owen  60 y.o. female  PRE-OPERATIVE DIAGNOSIS:  ABnormal CT, Gastric mass  POST-OPERATIVE DIAGNOSIS:  thick gastric antral fold; biopsies for h pylori, thick duodenal bulb folds  PROCEDURE:  Procedure(s): ESOPHAGOGASTRODUODENOSCOPY (EGD) (N/A)  SURGEON:  Surgeon(s) and Role:    * Otis Brace, MD - Primary   Recommendations -------------------------- - Advance diet - Once a day PPI - Avoid NSAIDs - follow up  in GI clinic in 4 weeks -she  may need EGD and or EUS to rule out submucosal lesion in the duodenal bulb based on biopsy findings. - Recommend outpatient colonoscopy for colon cancer screening - GI will sign off. Call us back if needed   Otis Brace, MD [facility]

## 2016-01-08 NOTE — Anesthesia Preprocedure Evaluation (Addendum)
Anesthesia Evaluation  Patient identified by MRN, date of birth, ID band Patient awake    Reviewed: Allergy & Precautions, H&P , NPO status , Patient's Chart, lab work & pertinent test results  Airway Mallampati: II   Neck ROM: full    Dental  (+) Dental Advisory Given   Pulmonary neg pulmonary ROS,    breath sounds clear to auscultation       Cardiovascular hypertension, Pt. on medications  Rhythm:regular Rate:Normal     Neuro/Psych    GI/Hepatic GERD  Medicated,  Endo/Other    Renal/GU      Musculoskeletal   Abdominal   Peds  Hematology   Anesthesia Other Findings   Reproductive/Obstetrics                            Anesthesia Physical Anesthesia Plan  ASA: II  Anesthesia Plan: MAC   Post-op Pain Management:    Induction: Intravenous  Airway Management Planned: Simple Face Mask and Natural Airway  Additional Equipment:   Intra-op Plan:   Post-operative Plan:   Informed Consent: I have reviewed the patients History and Physical, chart, labs and discussed the procedure including the risks, benefits and alternatives for the proposed anesthesia with the patient or authorized representative who has indicated his/her understanding and acceptance.   Dental advisory given  Plan Discussed with: CRNA, Anesthesiologist and Surgeon  Anesthesia Plan Comments:         Anesthesia Quick Evaluation

## 2016-01-08 NOTE — Anesthesia Postprocedure Evaluation (Signed)
Anesthesia Post Note  Patient: Morgan Owen  Procedure(s) Performed: Procedure(s) (LRB): ESOPHAGOGASTRODUODENOSCOPY (EGD) (N/A)  Patient location during evaluation: PACU Anesthesia Type: MAC Level of consciousness: awake and alert Pain management: pain level controlled Vital Signs Assessment: post-procedure vital signs reviewed and stable Respiratory status: spontaneous breathing, nonlabored ventilation, respiratory function stable and patient connected to nasal cannula oxygen Cardiovascular status: stable and blood pressure returned to baseline Anesthetic complications: no    Last Vitals:  Vitals:   01/08/16 1140 01/08/16 1200  BP: (!) 172/84 (!) 153/83  Pulse: 72 66  Resp: 15 14  Temp:  37 C    Last Pain:  Vitals:   01/08/16 1200  TempSrc: Oral  PainSc:                  [name]

## 2016-01-09 DIAGNOSIS — I10 Essential (primary) hypertension: Secondary | ICD-10-CM

## 2016-01-09 LAB — BASIC METABOLIC PANEL
ANION GAP: 10 (ref 5–15)
BUN: 16 mg/dL (ref 6–20)
CALCIUM: 9.2 mg/dL (ref 8.9–10.3)
CHLORIDE: 107 mmol/L (ref 101–111)
CO2: 23 mmol/L (ref 22–32)
CREATININE: 1.12 mg/dL — AB (ref 0.44–1.00)
GFR calc Af Amer: >60 mL/min (ref >60)
GFR calc non Af Amer: 52 mL/min — ABNORMAL LOW (ref >60)
GLUCOSE: 95 mg/dL (ref 65–99)
Potassium: 3.9 mmol/L (ref 3.5–5.1)
SODIUM: 140 mmol/L (ref 135–145)

## 2016-01-09 LAB — MAGNESIUM: Magnesium: 1.7 mg/dL (ref 1.7–2.4)

## 2016-01-09 MED ORDER — ATORVASTATIN CALCIUM 40 MG PO TABS: 40.0000 mg | ORAL_TABLET | Freq: Every day | ORAL | 1 refills | Status: AC

## 2016-01-09 MED ORDER — AMLODIPINE BESYLATE 10 MG PO TABS: 10.0000 mg | ORAL_TABLET | Freq: Every day | ORAL | 1 refills | Status: AC

## 2016-01-09 MED ORDER — HYDRALAZINE HCL 20 MG/ML IJ SOLN
10.0000 mg | Freq: Once | INTRAMUSCULAR | Status: AC
  Administered 2016-01-09: 10 mg via INTRAVENOUS
  Filled 2016-01-09: qty 1

## 2016-01-09 MED ORDER — AMLODIPINE BESYLATE 10 MG PO TABS
10.0000 mg | ORAL_TABLET | Freq: Every day | ORAL | Status: DC
  Administered 2016-01-09: 10 mg via ORAL
  Filled 2016-01-09: qty 1

## 2016-01-09 NOTE — Progress Notes (Signed)
Pt. Discharged to home Pt. D/C'd via wheelchair with NT Discharge information reviewed and given All personal belongings given to Pt.  Education discussed with teach back IV was d/c Tele d/c
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
## 2016-11-17 ENCOUNTER — Other Ambulatory Visit: Payer: Self-pay | Admitting: Urology

## 2016-11-17 DIAGNOSIS — D3002 Benign neoplasm of left kidney: Secondary | ICD-10-CM

## 2016-12-01 ENCOUNTER — Ambulatory Visit (HOSPITAL_COMMUNITY)
Admission: RE | Admit: 2016-12-01 | Discharge: 2016-12-01 | Disposition: A | Discharge status: Home / Self Care | Payer: BLUE CROSS/BLUE SHIELD | Source: Ambulatory Visit | Attending: Urology | Admitting: Urology

## 2016-12-01 DIAGNOSIS — D1771 Benign lipomatous neoplasm of kidney: Secondary | ICD-10-CM | POA: Insufficient documentation

## 2016-12-01 DIAGNOSIS — D3002 Benign neoplasm of left kidney: Secondary | ICD-10-CM | POA: Insufficient documentation

## 2017-01-19 ENCOUNTER — Ambulatory Visit: Payer: BLUE CROSS/BLUE SHIELD | Admitting: Urology

## 2017-03-22 ENCOUNTER — Emergency Department (HOSPITAL_COMMUNITY): Payer: BLUE CROSS/BLUE SHIELD

## 2017-03-22 ENCOUNTER — Emergency Department (HOSPITAL_COMMUNITY)
Admission: EM | Admit: 2017-03-22 | Discharge: 2017-03-23 | Disposition: A | Discharge status: Home / Self Care | Payer: BLUE CROSS/BLUE SHIELD | Attending: Emergency Medicine | Admitting: Emergency Medicine

## 2017-03-22 ENCOUNTER — Other Ambulatory Visit: Payer: Self-pay

## 2017-03-22 ENCOUNTER — Encounter (HOSPITAL_COMMUNITY): Payer: Self-pay | Admitting: Emergency Medicine

## 2017-03-22 DIAGNOSIS — Z79899 Other long term (current) drug therapy: Secondary | ICD-10-CM | POA: Diagnosis not present

## 2017-03-22 DIAGNOSIS — M79605 Pain in left leg: Secondary | ICD-10-CM | POA: Diagnosis present

## 2017-03-22 DIAGNOSIS — I10 Essential (primary) hypertension: Secondary | ICD-10-CM | POA: Diagnosis not present

## 2017-03-22 DIAGNOSIS — M5432 Sciatica, left side: Secondary | ICD-10-CM | POA: Diagnosis not present

## 2017-03-22 HISTORY — DX: Sciatica, unspecified side: M54.30

## 2017-03-22 LAB — I-STAT CHEM 8, ED
BUN: 20 mg/dL (ref 6–20)
CALCIUM ION: 1.05 mmol/L — AB (ref 1.15–1.40)
Chloride: 105 mmol/L (ref 101–111)
Creatinine, Ser: 0.9 mg/dL (ref 0.44–1.00)
GLUCOSE: 94 mg/dL (ref 65–99)
HCT: 41 % (ref 36.0–46.0)
Hemoglobin: 13.9 g/dL (ref 12.0–15.0)
Potassium: 3.8 mmol/L (ref 3.5–5.1)
SODIUM: 139 mmol/L (ref 135–145)
TCO2: 26 mmol/L (ref 22–32)

## 2017-03-22 LAB — I-STAT CREATININE, ED: Creatinine, Ser: 0.9 mg/dL (ref 0.44–1.00)

## 2017-03-22 MED ORDER — HYDROCODONE-ACETAMINOPHEN 5-325 MG PO TABS
1.0000 | ORAL_TABLET | Freq: Once | ORAL | Status: AC
  Administered 2017-03-22: 1 via ORAL
  Filled 2017-03-22: qty 1

## 2017-03-22 NOTE — ED Triage Notes (Signed)
Pt c/o L leg pain, states it feels like when she was told she had sciatica, denies injury.

## 2017-03-22 NOTE — ED Notes (Signed)
Patient transported to X-ray 
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
## 2017-03-23 MED ORDER — TRAMADOL HCL 50 MG PO TABS: 50.0000 mg | ORAL_TABLET | Freq: Four times a day (QID) | ORAL | 0 refills | Status: AC | PRN

## 2017-03-23 MED ORDER — KETOROLAC TROMETHAMINE 60 MG/2ML IM SOLN
30.0000 mg | Freq: Once | INTRAMUSCULAR | Status: AC
  Administered 2017-03-23: 30 mg via INTRAMUSCULAR
  Filled 2017-03-23: qty 2

## 2017-03-23 MED ORDER — METHOCARBAMOL 500 MG PO TABS: 500.0000 mg | ORAL_TABLET | Freq: Three times a day (TID) | ORAL | 0 refills | Status: AC

## 2017-03-23 NOTE — Discharge Instructions (Signed)
Alternate ice and heat to your lower back.  Avoid bending or twisting movements when possible.  Follow-up with your primary doctor for recheck, return to the ER for any worsening symptoms.

## 2019-05-30 ENCOUNTER — Ambulatory Visit: Payer: BLUE CROSS/BLUE SHIELD | Admitting: Urology

## 2020-04-18 ENCOUNTER — Telehealth: Payer: Self-pay

## 2020-04-18 ENCOUNTER — Other Ambulatory Visit: Payer: Self-pay | Admitting: Urology

## 2020-04-18 DIAGNOSIS — N2889 Other specified disorders of kidney and ureter: Secondary | ICD-10-CM

## 2020-04-18 NOTE — Telephone Encounter (Signed)
Patient seen last 12/2014, she got [place] in 2018 but no showed her 2018 appointment. She is scheduled to see you 05/14/20 do you want a renal ultrasound?

## 2020-04-18 NOTE — Telephone Encounter (Signed)
Patient called to make 2 year follow up appointment. However, she needs an order for an Xray at [facility] before her appointment on 05-14-20 with  Dr. Diona Fanti.  Please let patient know when order has been placed and when to go have it done.  Thanks, Helene Kelp

## 2020-04-18 NOTE — Telephone Encounter (Signed)
Yes, I think she can get it.  I put the order in

## 2020-04-19 NOTE — Telephone Encounter (Signed)
Pt.notified

## 2020-05-08 ENCOUNTER — Other Ambulatory Visit (HOSPITAL_COMMUNITY): Payer: Self-pay | Admitting: Adult Health Nurse Practitioner

## 2020-05-08 DIAGNOSIS — G8929 Other chronic pain: Secondary | ICD-10-CM

## 2020-05-08 DIAGNOSIS — M545 Low back pain, unspecified: Secondary | ICD-10-CM

## 2020-05-10 ENCOUNTER — Ambulatory Visit (HOSPITAL_COMMUNITY)
Admission: RE | Admit: 2020-05-10 | Discharge: 2020-05-10 | Disposition: A | Discharge status: Home / Self Care | Payer: Medicare Other | Source: Ambulatory Visit | Attending: Urology | Admitting: Urology

## 2020-05-10 ENCOUNTER — Ambulatory Visit (HOSPITAL_COMMUNITY)
Admission: RE | Admit: 2020-05-10 | Discharge: 2020-05-10 | Disposition: A | Discharge status: Home / Self Care | Payer: Medicare Other | Source: Ambulatory Visit | Attending: Adult Health Nurse Practitioner | Admitting: Adult Health Nurse Practitioner

## 2020-05-10 ENCOUNTER — Other Ambulatory Visit: Payer: Self-pay

## 2020-05-10 DIAGNOSIS — M545 Low back pain, unspecified: Secondary | ICD-10-CM | POA: Insufficient documentation

## 2020-05-10 DIAGNOSIS — G8929 Other chronic pain: Secondary | ICD-10-CM | POA: Insufficient documentation

## 2020-05-10 DIAGNOSIS — N2889 Other specified disorders of kidney and ureter: Secondary | ICD-10-CM | POA: Insufficient documentation

## 2020-05-13 NOTE — Progress Notes (Signed)
History of Present Illness: Morgan Owen is a 64 y.o. year old female here for followup of a left renal AML.  3.29.2022: It has been seven years since her follwup. Recent renal U/S-- Renal measurements: 9.9 x 5.2 x 4.5 cm = volume: 121 mL. Echogenicity within normal limits. Slightly decreased size of echogenic interpolar renal mass which measures 2.5 x 2.4 x 2.2 cm previously measuring 3.1 cm characterized as an angiomyolipoma on CT dated 2017. No hydronephrosis visualized graph she is not experienced any urologic issues since her last visit 7 years ago.  Past Medical History:  Diagnosis Date  . GERD (gastroesophageal reflux disease)   . Hypertension   . Sciatica     Past Surgical History:  Procedure Laterality Date  . CESAREAN SECTION    . ESOPHAGOGASTRODUODENOSCOPY N/A 01/08/2016   Procedure: ESOPHAGOGASTRODUODENOSCOPY (EGD);  Surgeon: Otis Brace, MD;  Location: [place];  Service: Gastroenterology;  Laterality: N/A;    Home Medications:  (Not in a hospital admission)   Allergies: No Known Allergies  Family History  Problem Relation Age of Onset  . Diabetes Mellitus I Mother   . Diabetes Mellitus I Sister   . Diabetes Mellitus I Brother     Social History:  reports that she has never smoked. She has never used smokeless tobacco. She reports that she does not drink alcohol and does not use drugs.  ROS: A complete review of systems was performed.  All systems are negative except for pertinent findings as noted.  Physical Exam:  Vital signs in last 24 hours: @VSRANGES @ General:  Alert and oriented, No acute distress HEENT: Normocephalic, atraumatic Neck: No JVD or lymphadenopathy Cardiovascular: Regular rate  Lungs: Normal inspiratory/expiratory excursion Extremities: No edema Neurologic: Grossly intact  I have reviewed prior pt notes, last from 2017  I have reviewed urinalysis results  I have independently reviewed prior imaging--ultrasound images  reviewed with patient   Impression/Assessment:  Stable angiomyolipoma of left kidney  Plan:  I will see her back in 2 years for her next checkup with renal ultrasound  Lillette Boxer Vannessa Godown 05/13/2020, 8:47 PM  Lillette Boxer. Labrina Lines MD

## 2020-05-14 ENCOUNTER — Other Ambulatory Visit: Payer: Self-pay

## 2020-05-14 ENCOUNTER — Ambulatory Visit (INDEPENDENT_AMBULATORY_CARE_PROVIDER_SITE_OTHER): Payer: Medicare Other | Admitting: Urology

## 2020-05-14 ENCOUNTER — Encounter: Payer: Self-pay | Admitting: Urology

## 2020-05-14 VITALS — BP 178/113 | HR 99 | Temp 98.3°F | Ht 67.0 in | Wt 232.0 lb

## 2020-05-14 DIAGNOSIS — N2889 Other specified disorders of kidney and ureter: Secondary | ICD-10-CM | POA: Diagnosis not present

## 2020-05-14 LAB — MICROSCOPIC EXAMINATION
Epithelial Cells (non renal): >10 /hpf — AB (ref 0–10)
RBC, Urine: NONE SEEN /hpf (ref 0–2)
Renal Epithel, UA: NONE SEEN /hpf

## 2020-05-14 LAB — URINALYSIS, ROUTINE W REFLEX MICROSCOPIC
Bilirubin, UA: NEGATIVE
Glucose, UA: NEGATIVE
Ketones, UA: NEGATIVE
Nitrite, UA: NEGATIVE
Protein,UA: NEGATIVE
RBC, UA: NEGATIVE
Specific Gravity, UA: 1.015 (ref 1.005–1.030)
Urobilinogen, Ur: 1 mg/dL (ref 0.2–1.0)
pH, UA: 5.5 (ref 5.0–7.5)

## 2020-05-14 NOTE — Progress Notes (Signed)
Urological Symptom Review  Patient is experiencing the following symptoms: Frequent urination Hard to postpone urination Get up at night to urinate Leakage of urine   Review of Systems  Gastrointestinal (upper)  : Negative for upper GI symptoms  Gastrointestinal (lower) : Negative for lower GI symptoms  Constitutional : Negative for symptoms  Skin: Itching  Eyes: Negative for eye symptoms  Ear/Nose/Throat : Negative for Ear/Nose/Throat symptoms  Hematologic/Lymphatic: Negative for Hematologic/Lymphatic symptoms  Cardiovascular : Negative for cardiovascular symptoms  Respiratory : Cough  Endocrine: Negative for endocrine symptoms  Musculoskeletal: Back pain Joint pain  Neurological: Negative for neurological symptoms  Psychologic: Negative for psychiatric symptoms

## 2020-07-11 LAB — COLOGUARD: COLOGUARD: NEGATIVE

## 2021-12-12 IMAGING — DX DG LUMBAR SPINE 2-3V
3 series · 3 of 3 positions shown · non-contrast
Comparison: Lumbar spine radiograph March 23, 2016

CLINICAL DATA: Chronic midline lower back pain.

EXAM:
LUMBAR SPINE - 2-3 VIEW

[l-spine ap]
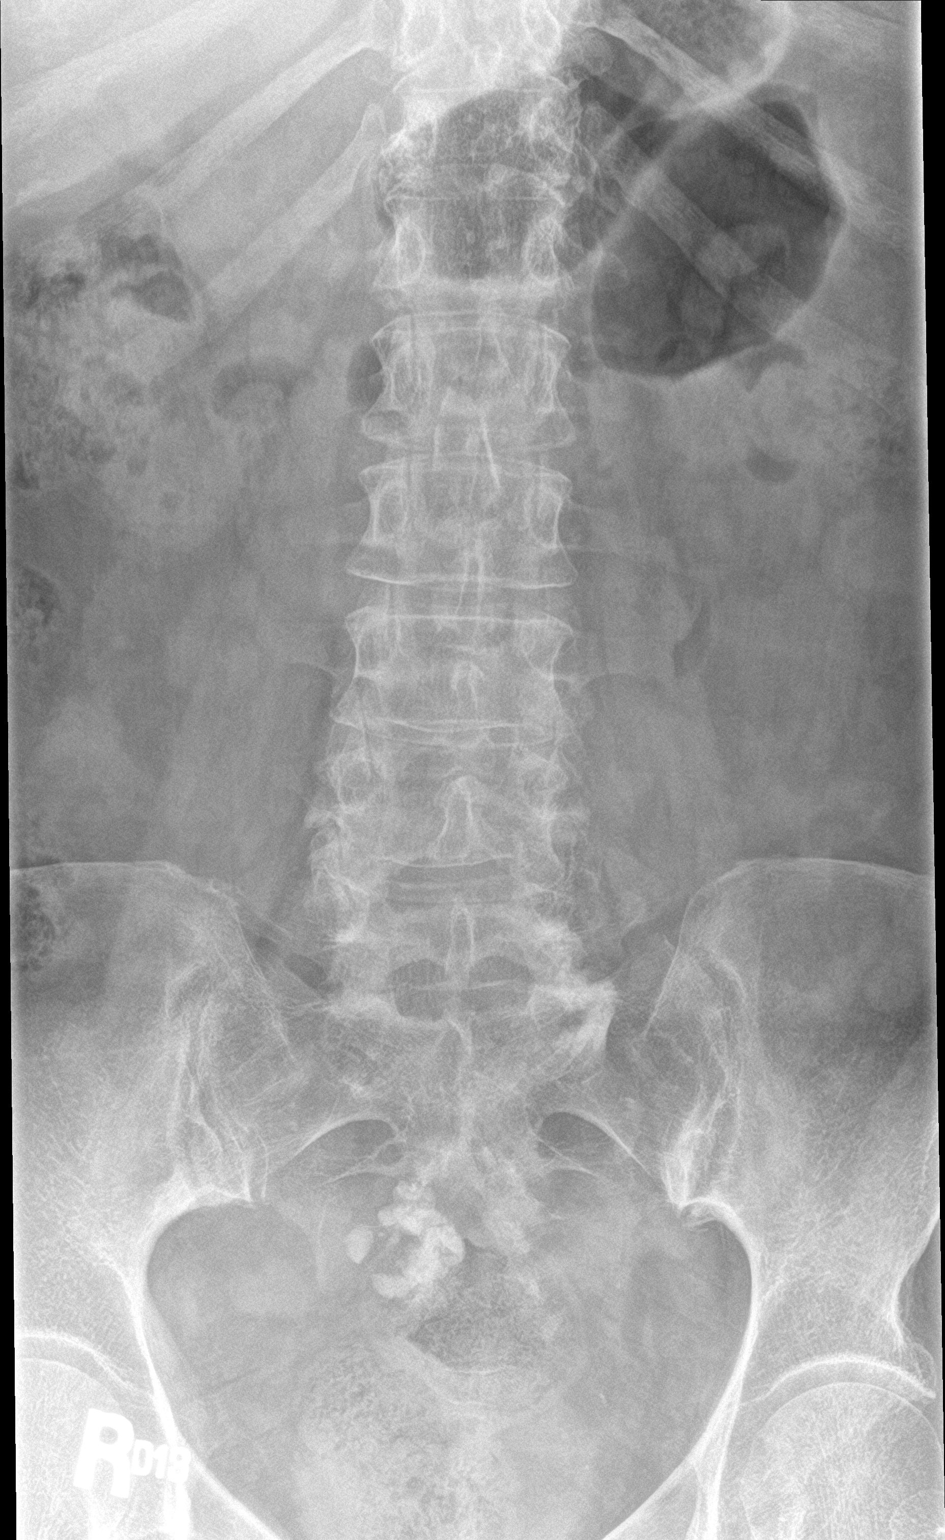

[l-spine lat]
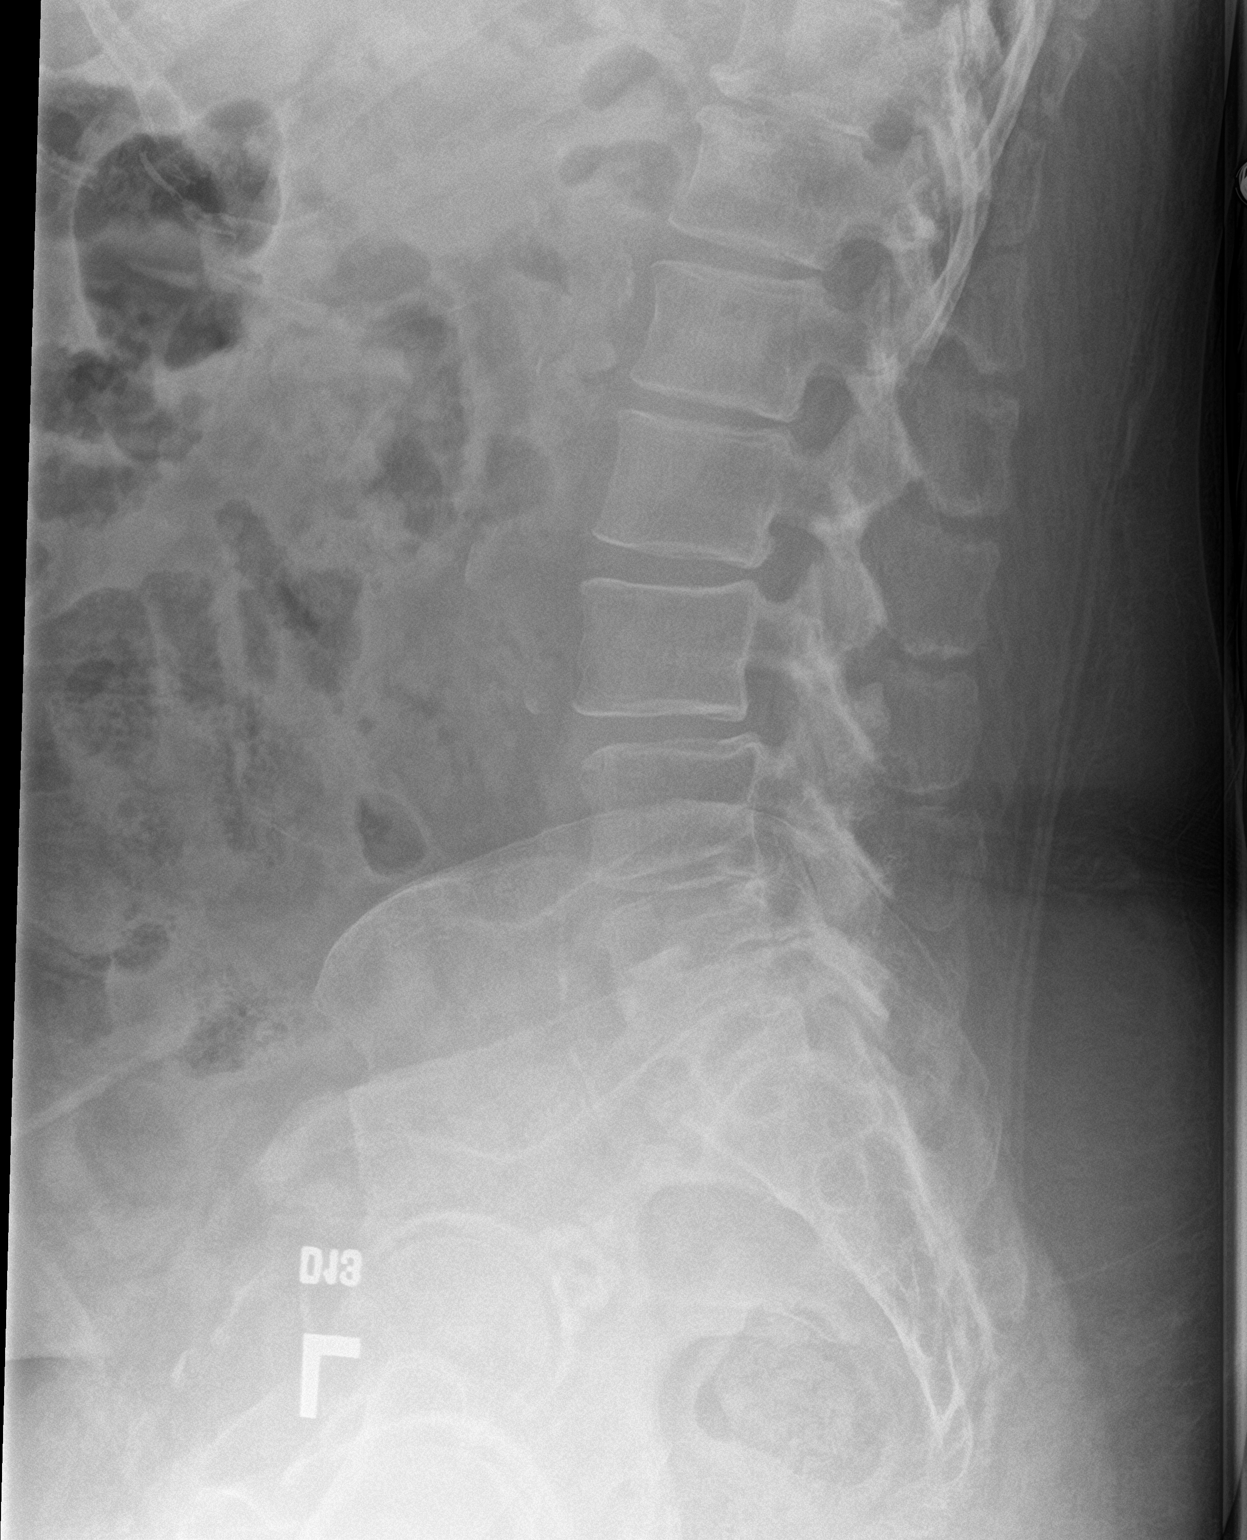

[l-spine spot]
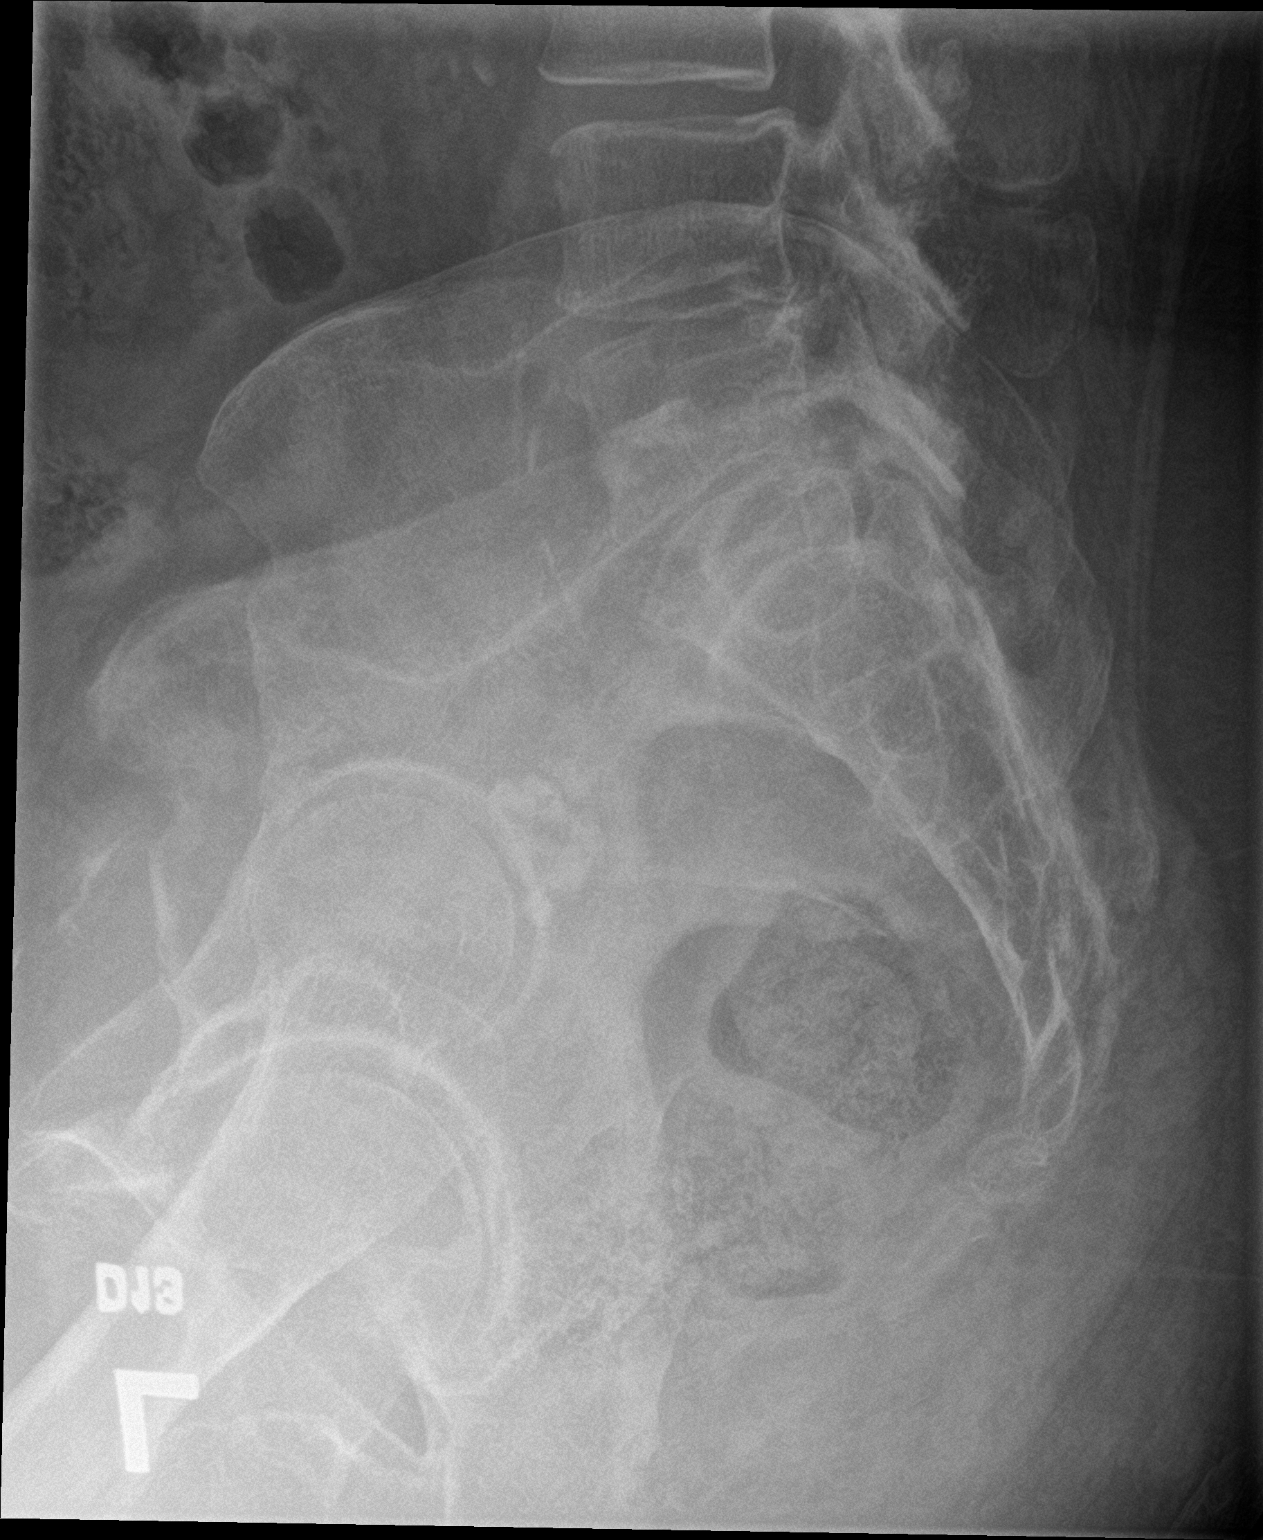

[3 of 3 positions shown; findings below may reference images not displayed]

FINDINGS: There are 6 lumbar type vertebral bodies. No evidence of lumbar
spine fracture. Degenerative grade 1 anterolisthesis of L4 on L5.
Multilevel disc space narrowing most notable at T12-T1 and L6-S1.
Facet hypertrophy.
IMPRESSION: 1. No acute osseous abnormality.
2. Similar mild to moderate degenerative changes spine.

## 2021-12-12 IMAGING — US US RENAL
1 series · 14 of 25 positions shown · non-contrast
Comparison: CT abdomen pelvis January 06, 2016 and renal
ultrasound December 01, 2016

CLINICAL DATA: Follow-up renal mass.

EXAM:
RENAL / URINARY TRACT ULTRASOUND COMPLETE

[Series 1: us renal · 14 of 54 slices shown]
[im 1/54]
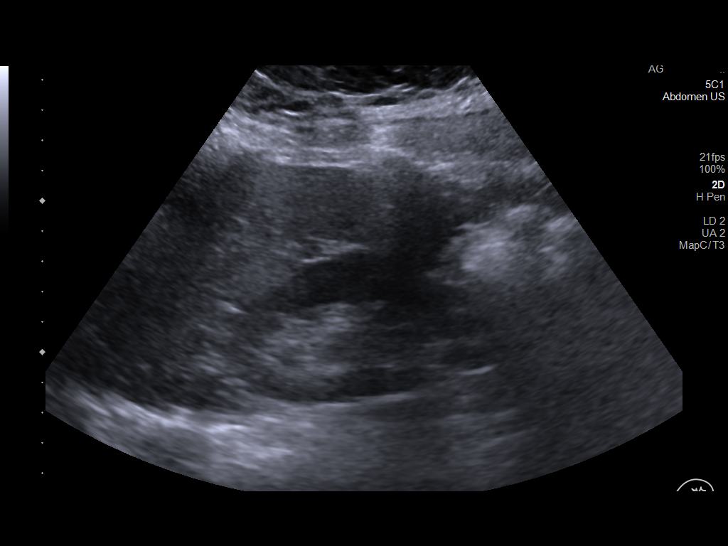
[im 5/54]
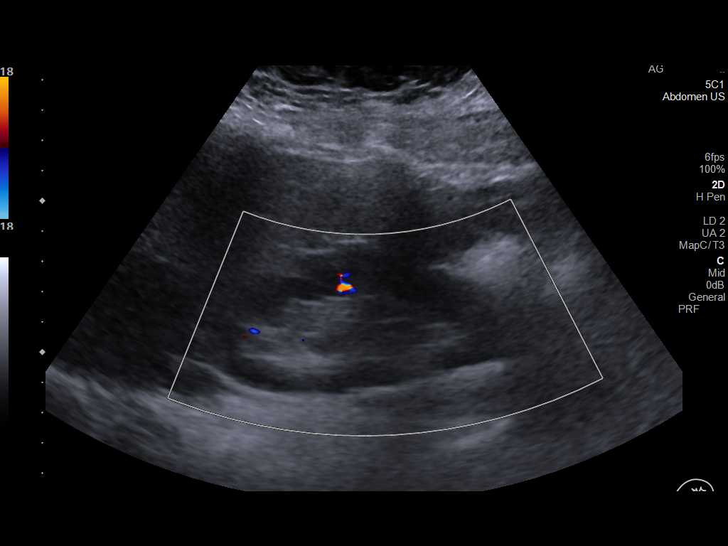
[im 9/54]
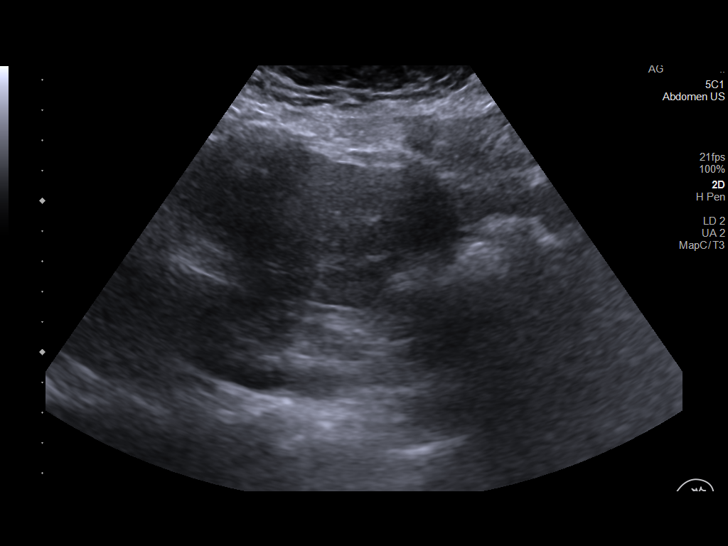
[im 14/54]
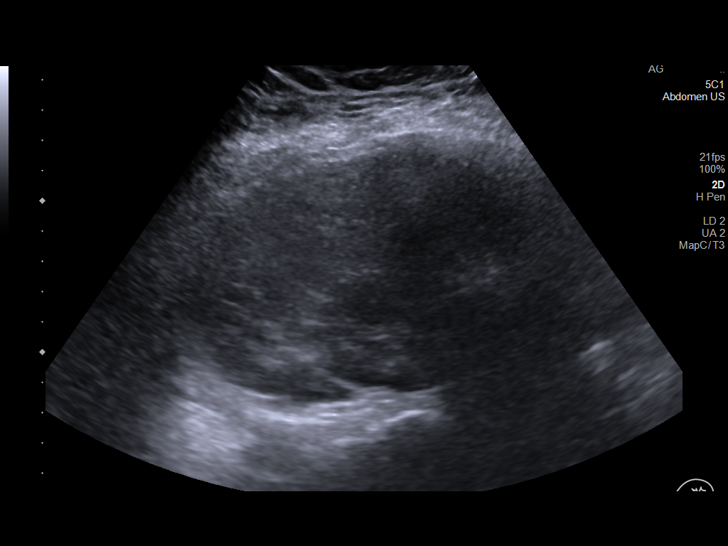
[im 18/54]
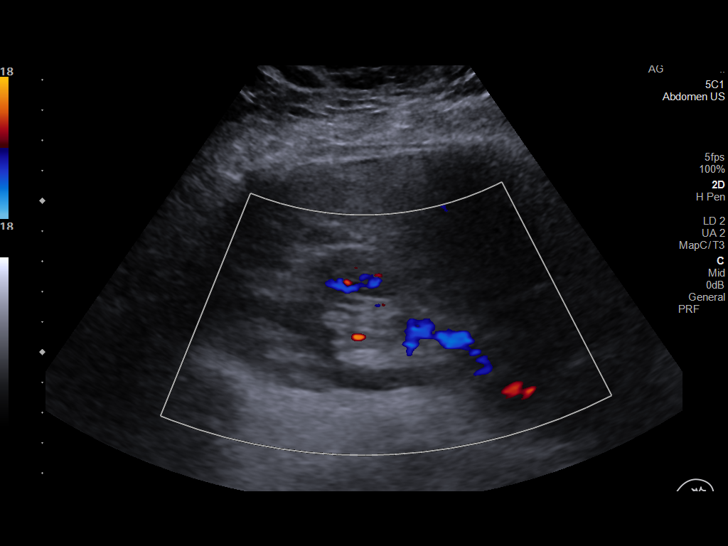
[im 20/54]
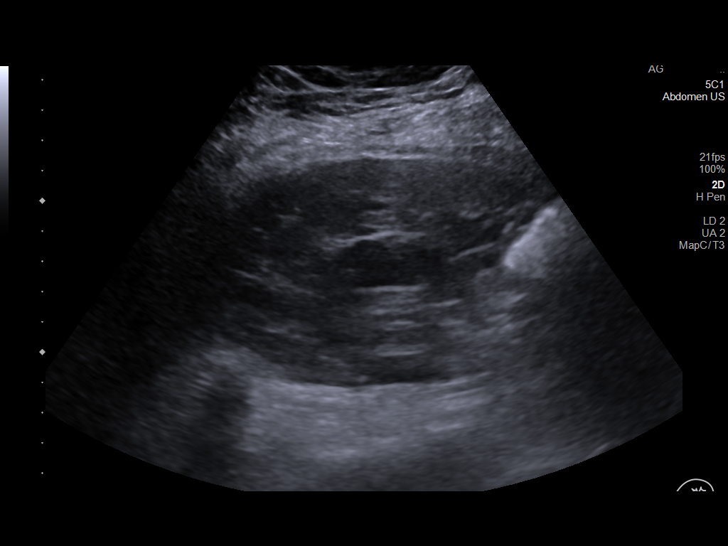
[im 25/54]
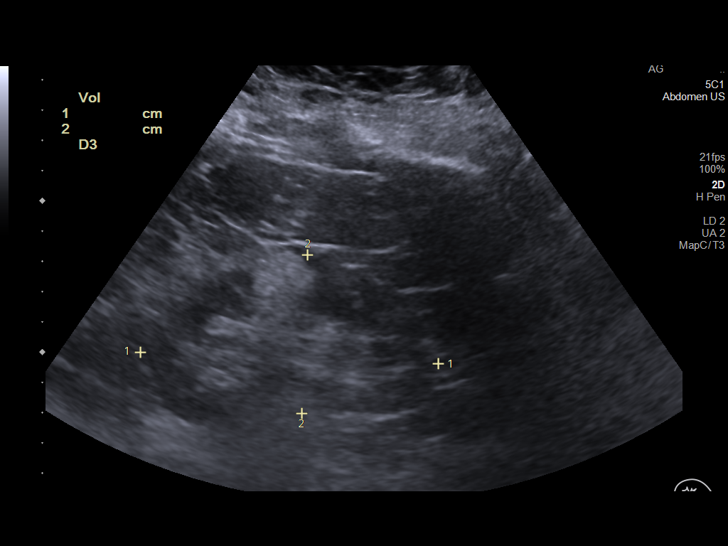
[im 29/54]
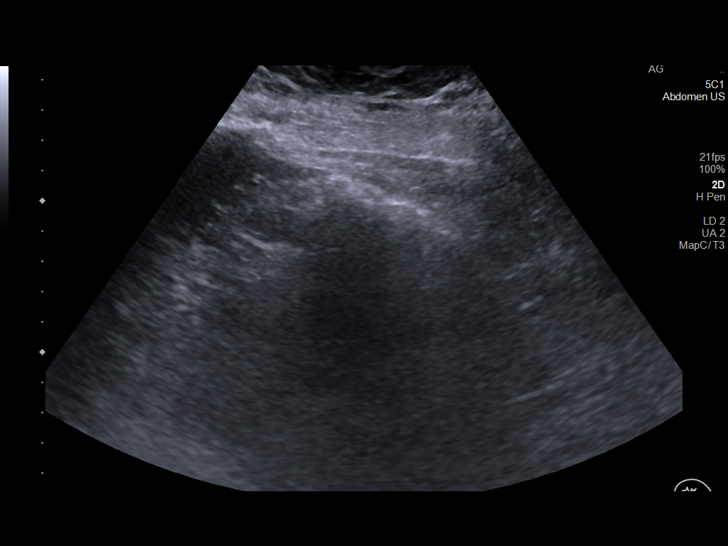
[im 34/54]
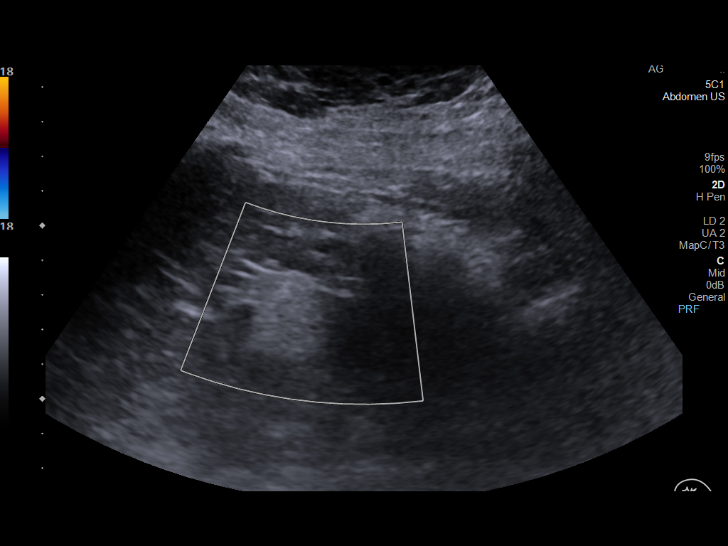
[im 36/54]
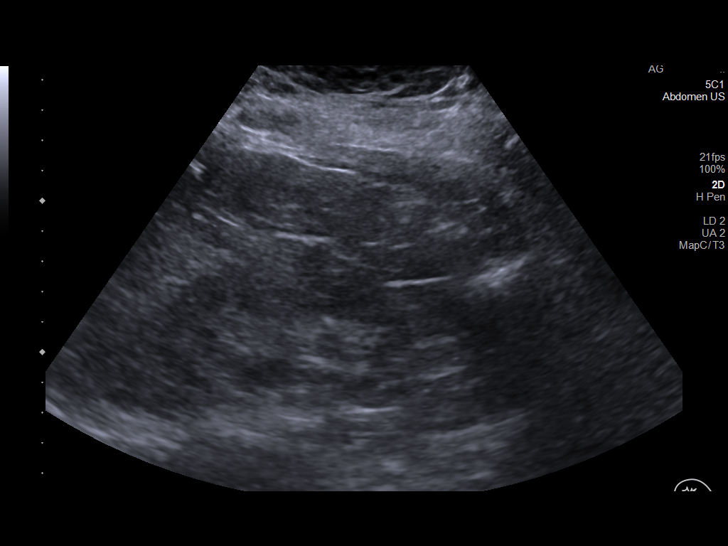
[im 40/54]
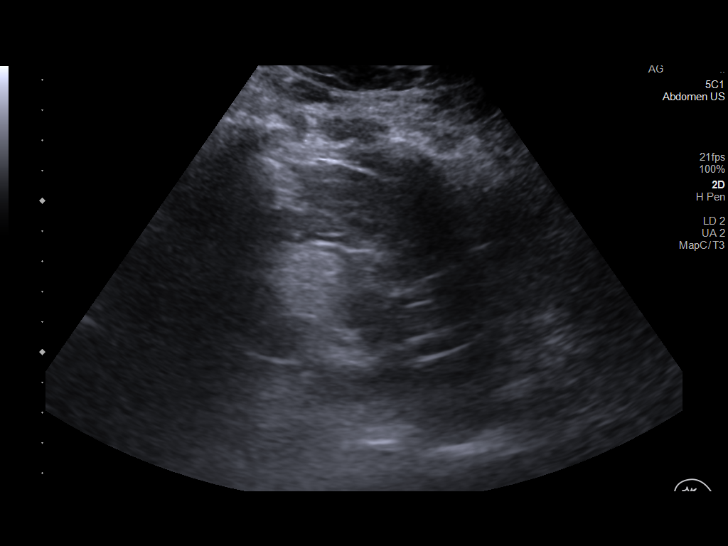
[im 45/54]
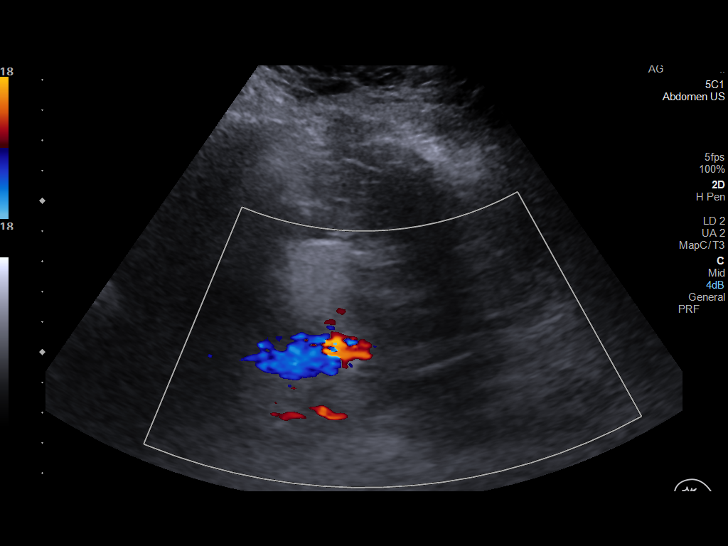
[im 49/54]
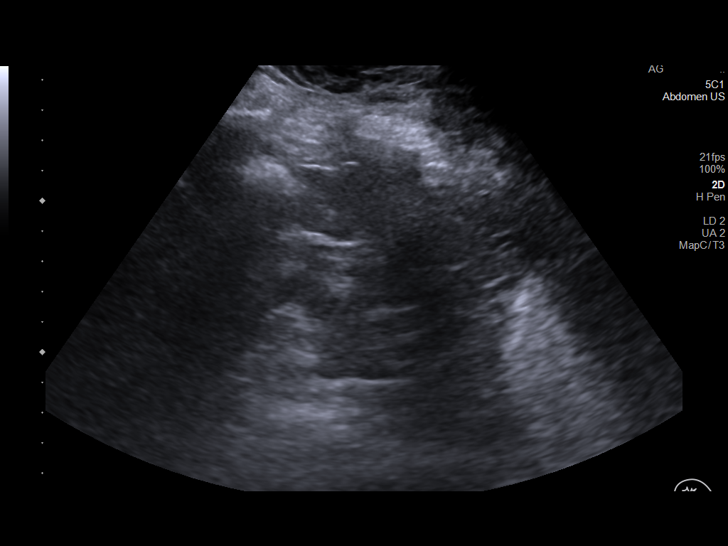
[im 54/54]
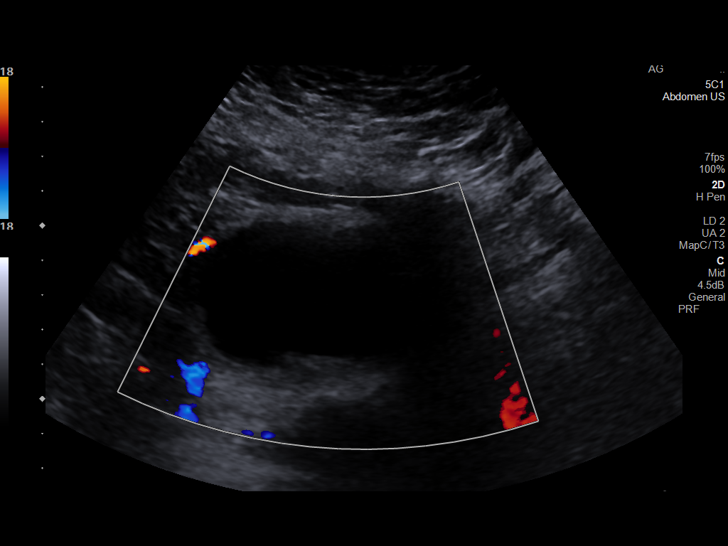

[14 of 25 positions shown; findings below may reference images not displayed]

FINDINGS: Right Kidney:

Renal measurements: 10.4 x 5.2 x 4.7 cm = volume: 133 mL.
Echogenicity within normal limits. No mass or hydronephrosis
visualized.

Left Kidney:

Renal measurements: 9.9 x 5.2 x 4.5 cm = volume: 121 mL.
Echogenicity within normal limits. Slightly decreased size of
echogenic interpolar renal mass which measures 2.5 x 2.4 x 2.2 cm
previously measuring 3.1 cm characterized as an angiomyolipoma on CT
dated 9926. No hydronephrosis visualized.

Bladder:

Appears normal for degree of bladder distention.

Other:

None.
IMPRESSION: Slightly decreased size of the left renal angiomyolipoma.

No hydronephrosis or other acute abnormality.

## 2022-10-27 ENCOUNTER — Other Ambulatory Visit: Payer: Self-pay

## 2022-10-27 ENCOUNTER — Encounter: Payer: Self-pay | Admitting: Physical Therapy

## 2022-10-27 ENCOUNTER — Ambulatory Visit: Payer: Medicare Other | Attending: Pain Medicine | Admitting: Physical Therapy

## 2022-10-27 DIAGNOSIS — R293 Abnormal posture: Secondary | ICD-10-CM | POA: Diagnosis present

## 2022-10-27 DIAGNOSIS — M5459 Other low back pain: Secondary | ICD-10-CM | POA: Diagnosis present
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
## 2022-11-03 ENCOUNTER — Ambulatory Visit: Payer: Medicare Other | Admitting: Physical Therapy

## 2022-11-05 ENCOUNTER — Ambulatory Visit: Payer: Medicare Other | Admitting: Physical Therapy

## 2022-11-05 DIAGNOSIS — M5459 Other low back pain: Secondary | ICD-10-CM | POA: Diagnosis not present

## 2022-11-05 DIAGNOSIS — R293 Abnormal posture: Secondary | ICD-10-CM
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
## 2022-11-12 ENCOUNTER — Ambulatory Visit: Payer: Medicare Other | Admitting: Physical Therapy

## 2022-11-12 DIAGNOSIS — M5459 Other low back pain: Secondary | ICD-10-CM

## 2022-11-12 DIAGNOSIS — R293 Abnormal posture: Secondary | ICD-10-CM
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
## 2022-11-19 ENCOUNTER — Ambulatory Visit: Payer: Medicare Other | Attending: Pain Medicine

## 2022-11-19 DIAGNOSIS — M5459 Other low back pain: Secondary | ICD-10-CM | POA: Insufficient documentation

## 2022-11-19 DIAGNOSIS — R293 Abnormal posture: Secondary | ICD-10-CM | POA: Diagnosis present
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
## 2022-11-26 ENCOUNTER — Ambulatory Visit: Payer: Medicare Other | Admitting: *Deleted

## 2022-11-26 DIAGNOSIS — R293 Abnormal posture: Secondary | ICD-10-CM

## 2022-11-26 DIAGNOSIS — M5459 Other low back pain: Secondary | ICD-10-CM
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
## 2022-12-03 ENCOUNTER — Ambulatory Visit: Payer: Medicare Other | Admitting: Physical Therapy

## 2022-12-03 DIAGNOSIS — M5459 Other low back pain: Secondary | ICD-10-CM

## 2022-12-03 DIAGNOSIS — R293 Abnormal posture: Secondary | ICD-10-CM
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
## 2022-12-10 ENCOUNTER — Ambulatory Visit: Payer: Medicare Other | Admitting: Physical Therapy

## 2022-12-10 DIAGNOSIS — R293 Abnormal posture: Secondary | ICD-10-CM

## 2022-12-10 DIAGNOSIS — M5459 Other low back pain: Secondary | ICD-10-CM | POA: Diagnosis not present
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
## 2022-12-21 ENCOUNTER — Ambulatory Visit: Payer: Medicare Other | Admitting: Physical Therapy

## 2022-12-31 ENCOUNTER — Encounter: Payer: Medicare Other | Admitting: Physical Therapy

## 2023-01-07 ENCOUNTER — Ambulatory Visit: Payer: Medicare Other | Attending: Pain Medicine

## 2023-01-07 DIAGNOSIS — R293 Abnormal posture: Secondary | ICD-10-CM | POA: Diagnosis present

## 2023-01-07 DIAGNOSIS — M5459 Other low back pain: Secondary | ICD-10-CM | POA: Diagnosis present
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
## 2023-01-12 ENCOUNTER — Ambulatory Visit: Payer: Medicare Other

## 2023-01-12 DIAGNOSIS — M5459 Other low back pain: Secondary | ICD-10-CM

## 2023-01-12 DIAGNOSIS — R293 Abnormal posture: Secondary | ICD-10-CM
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
## 2023-01-21 ENCOUNTER — Ambulatory Visit: Payer: Medicare Other | Attending: Pain Medicine

## 2023-01-21 DIAGNOSIS — R293 Abnormal posture: Secondary | ICD-10-CM | POA: Insufficient documentation

## 2023-01-21 DIAGNOSIS — M5459 Other low back pain: Secondary | ICD-10-CM | POA: Insufficient documentation
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
